# Patient Record
Sex: Male | Born: 1945 | Race: Black or African American | Hispanic: No | Marital: Married
Health system: Southern US, Community
[De-identification: ages and names within clinical notes are randomized; demographics above are authoritative.]

---

## 2020-01-03 ENCOUNTER — Encounter: Payer: Self-pay | Admitting: *Deleted

## 2020-01-03 ENCOUNTER — Other Ambulatory Visit: Payer: Self-pay | Admitting: *Deleted

## 2020-01-03 DIAGNOSIS — Z20822 Contact with and (suspected) exposure to covid-19: Secondary | ICD-10-CM

## 2020-01-04 LAB — NOVEL CORONAVIRUS, NAA: SARS-CoV-2, NAA: NOT DETECTED

## 2020-01-05 ENCOUNTER — Telehealth: Payer: Self-pay | Admitting: *Deleted

## 2020-01-05 NOTE — Telephone Encounter (Signed)
Pt notified that his test result for covid 19 was negative. He voiced understanding.

## 2021-03-29 ENCOUNTER — Encounter (HOSPITAL_BASED_OUTPATIENT_CLINIC_OR_DEPARTMENT_OTHER): Payer: Self-pay

## 2021-03-29 ENCOUNTER — Emergency Department (HOSPITAL_BASED_OUTPATIENT_CLINIC_OR_DEPARTMENT_OTHER)
Admission: EM | Admit: 2021-03-29 | Discharge: 2021-03-29 | Disposition: A | Discharge status: Home / Self Care | Payer: Medicare HMO | Attending: Emergency Medicine | Admitting: Emergency Medicine

## 2021-03-29 ENCOUNTER — Emergency Department (HOSPITAL_BASED_OUTPATIENT_CLINIC_OR_DEPARTMENT_OTHER): Payer: Medicare HMO

## 2021-03-29 ENCOUNTER — Other Ambulatory Visit: Payer: Self-pay

## 2021-03-29 DIAGNOSIS — M545 Low back pain, unspecified: Secondary | ICD-10-CM | POA: Diagnosis not present

## 2021-03-29 DIAGNOSIS — K5900 Constipation, unspecified: Secondary | ICD-10-CM | POA: Insufficient documentation

## 2021-03-29 MED ORDER — DIAZEPAM 5 MG PO TABS
5.0000 mg | ORAL_TABLET | Freq: Once | ORAL | Status: AC
  Administered 2021-03-29: 5 mg via ORAL
  Filled 2021-03-29: qty 1

## 2021-03-29 MED ORDER — KETOROLAC TROMETHAMINE 60 MG/2ML IM SOLN
60.0000 mg | Freq: Once | INTRAMUSCULAR | Status: AC
  Administered 2021-03-29: 60 mg via INTRAMUSCULAR
  Filled 2021-03-29: qty 2

## 2021-03-29 MED ORDER — IBUPROFEN 400 MG PO TABS: 400.0000 mg | ORAL_TABLET | Freq: Three times a day (TID) | ORAL | 0 refills | Status: AC

## 2021-03-29 MED ORDER — DIAZEPAM 5 MG PO TABS: 5.0000 mg | ORAL_TABLET | Freq: Four times a day (QID) | ORAL | 0 refills | Status: AC | PRN

## 2021-03-29 NOTE — ED Provider Notes (Signed)
[facility] EMERGENCY DEPARTMENT Provider Note   CSN: [id number] Arrival date & time: 03/29/21  2831     History Chief Complaint  Patient presents with  . Back Pain    Bryan Bennett is a 75 y.o. male.  75 year old male the presents emerged from today with bilateral lower back pain.  Patient states is worse on the left than the right.  Been going on for approximately a week.  Its intermittent.  Sometimes it improves.  He gets better with heat.  He is tried Tylenol couple times which does not seem to help much.  Patient states that throughout the day it often gets better but especially wakes up in the morning and at night it has been waking him up but tonight it woke him up and was worse than previously.  No trauma.  No history of cancer.  No IV drug use fevers or recent illnesses.  No neurologic changes.  Is able to ambulate slightly slower than normal but otherwise without difficulty. Also with increased urination.         History reviewed. No pertinent past medical history.  There are no problems to display for this patient.   History reviewed. No pertinent surgical history.     History reviewed. No pertinent family history.     Home Medications Prior to Admission medications   Not on File    Allergies    Patient has no known allergies.  Review of Systems   Review of Systems  All other systems reviewed and are negative.   Physical Exam Updated Vital Signs BP (!) 178/84 (BP Location: Right Arm)   Pulse 72   Temp 97.8 F (36.6 C) (Oral)   Resp 18   Ht 5\' 9"  (1.753 m)   Wt 86.6 kg   SpO2 96%   BMI 28.21 kg/m   Physical Exam Vitals and nursing note reviewed.  Constitutional:      Appearance: He is well-developed.  HENT:     Head: Normocephalic and atraumatic.     Nose: Nose normal. No congestion or rhinorrhea.     Mouth/Throat:     Mouth: Mucous membranes are moist.     Pharynx: Oropharynx is clear.  Eyes:     Pupils: Pupils are equal,  round, and reactive to light.  Cardiovascular:     Rate and Rhythm: Normal rate.  Pulmonary:     Effort: Pulmonary effort is normal. No respiratory distress.  Abdominal:     General: There is no distension.  Musculoskeletal:        General: Tenderness (bilateral lower back) present. Normal range of motion.     Cervical back: Normal range of motion.  Skin:    General: Skin is warm and dry.     Coloration: Skin is not jaundiced or pale.  Neurological:     General: No focal deficit present.     Mental Status: He is alert.     ED Results / Procedures / Treatments   Labs (all labs ordered are listed, but only abnormal results are displayed) Labs Reviewed - No data to display  EKG None  Radiology DG Lumbar Spine Complete  Result Date: 03/29/2021 CLINICAL DATA:  Back pain EXAM: LUMBAR SPINE - COMPLETE 4+ VIEW COMPARISON:  None similar and available FINDINGS: No evidence of fracture, bone lesion, or endplate erosion. Generalized mild disc space narrowing and endplate ridging for age. IMPRESSION: No acute or focal finding. Electronically Signed   By: 03/31/2021.D.  On: 03/29/2021 06:57    Procedures Procedures   Medications Ordered in ED Medications  ketorolac (TORADOL) injection 60 mg (60 mg Intramuscular Given 03/29/21 0644)  diazepam (VALIUM) tablet 5 mg (5 mg Oral Given 03/29/21 5427)    ED Course  I have reviewed the triage vital signs and the nursing notes.  Pertinent labs & imaging results that were available during my care of the patient were reviewed by me and considered in my medical decision making (see chart for details).    MDM Rules/Calculators/A&P                          Is a patient 75 years old I did do an x-ray and this was negative.  On my evaluation x-ray does have quite a bit of constipation and air associated with that but not see any obvious lesions on his spine.  No obvious fractures.  I considered possible urinary tract infection however his  polyuria is been going on for years he has no other urinary symptoms.  He was hypertensive when he got here but he states that his blood pressures run in the 140s he thinks is high because of the pain.  I did consider aortic dissection because of that and/or aneurysm however he has had a CT scan last few years it did not show an aneurysm and he has equal palpable DP pulses and no neurologic changes and all the pain is in his lower back and it seems to be both sides at this point so I do not think CT scan is necessary to evaluate for dissection.  It very much seems muscular in nature will treat as same. PCP to follow up if not improving.   Final Clinical Impression(s) / ED Diagnoses Final diagnoses:  Acute bilateral low back pain without sciatica  Constipation, unspecified constipation type    Rx / DC Orders ED Discharge Orders    None       Benno Brensinger, Barbara Cower, MD 03/29/21 2302

## 2021-03-29 NOTE — Discharge Instructions (Addendum)

## 2021-03-29 NOTE — ED Triage Notes (Signed)
Patient reports left lower back pain for 1 week. Ambulated to room without difficulty. NAD noted

## 2021-09-06 DEATH — deceased

## 2022-03-08 IMAGING — DX DG LUMBAR SPINE COMPLETE 4+V
5 series · 5 of 5 positions shown · non-contrast
Comparison: None similar and available

CLINICAL DATA: Back pain

EXAM:
LUMBAR SPINE - COMPLETE 4+ VIEW

[l-spine ap]
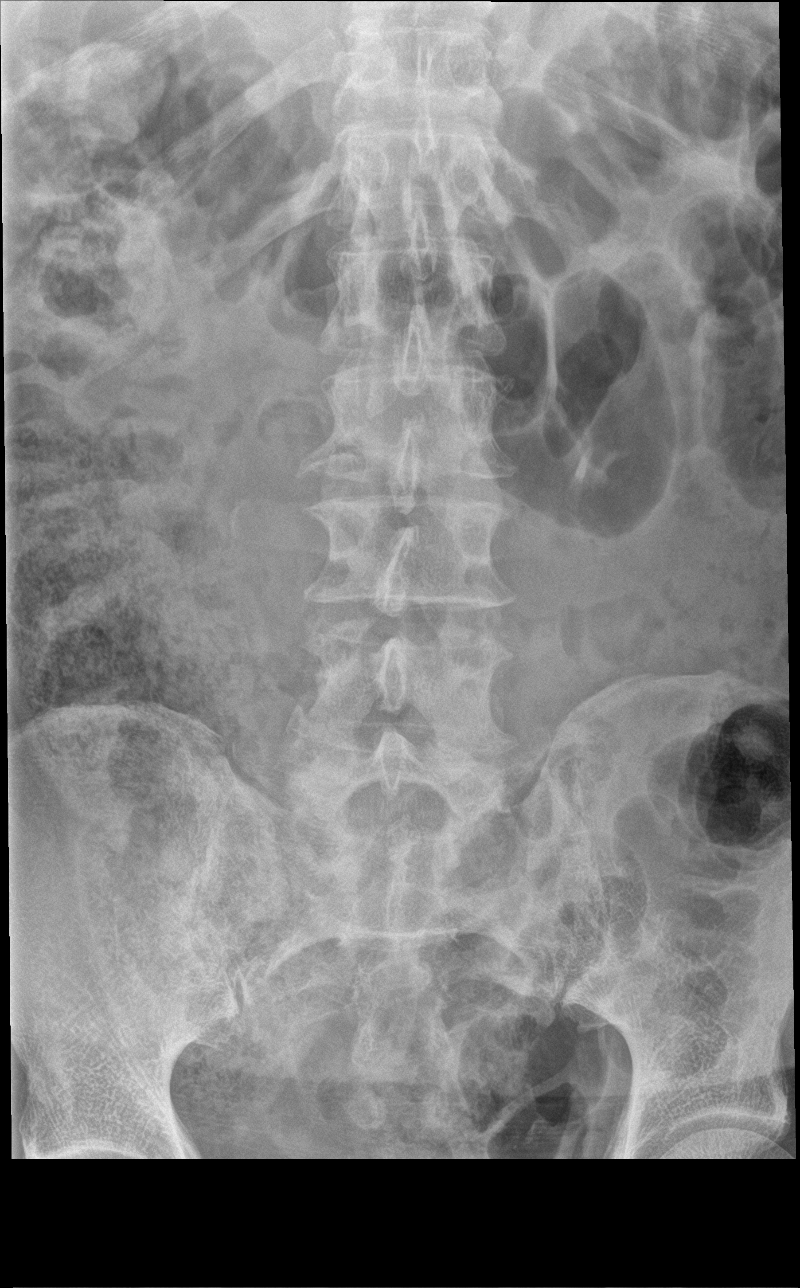

[l-spine obl (1 of 2)]
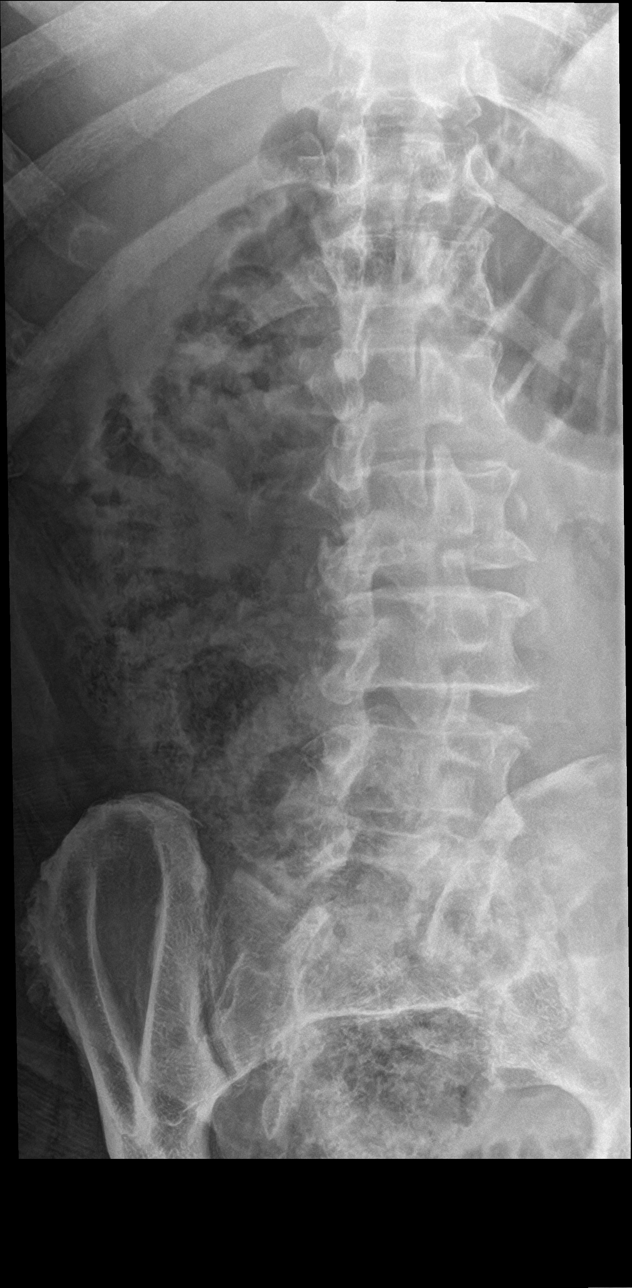

[l-spine obl (2 of 2)]
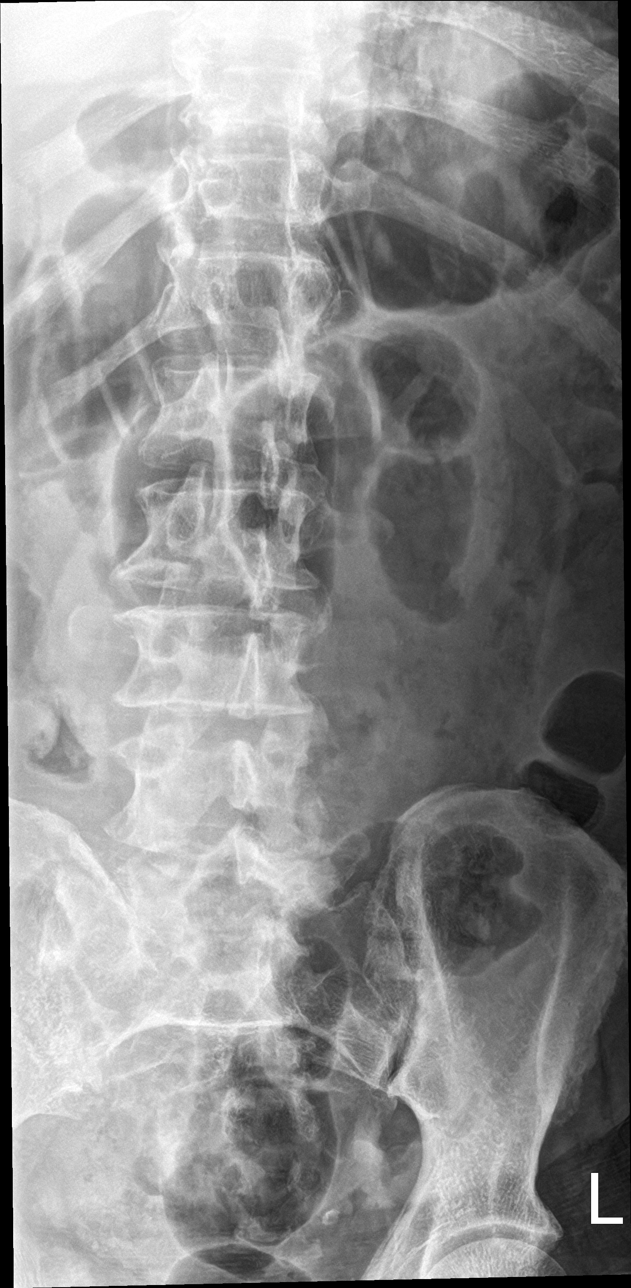

[l-spine lat]
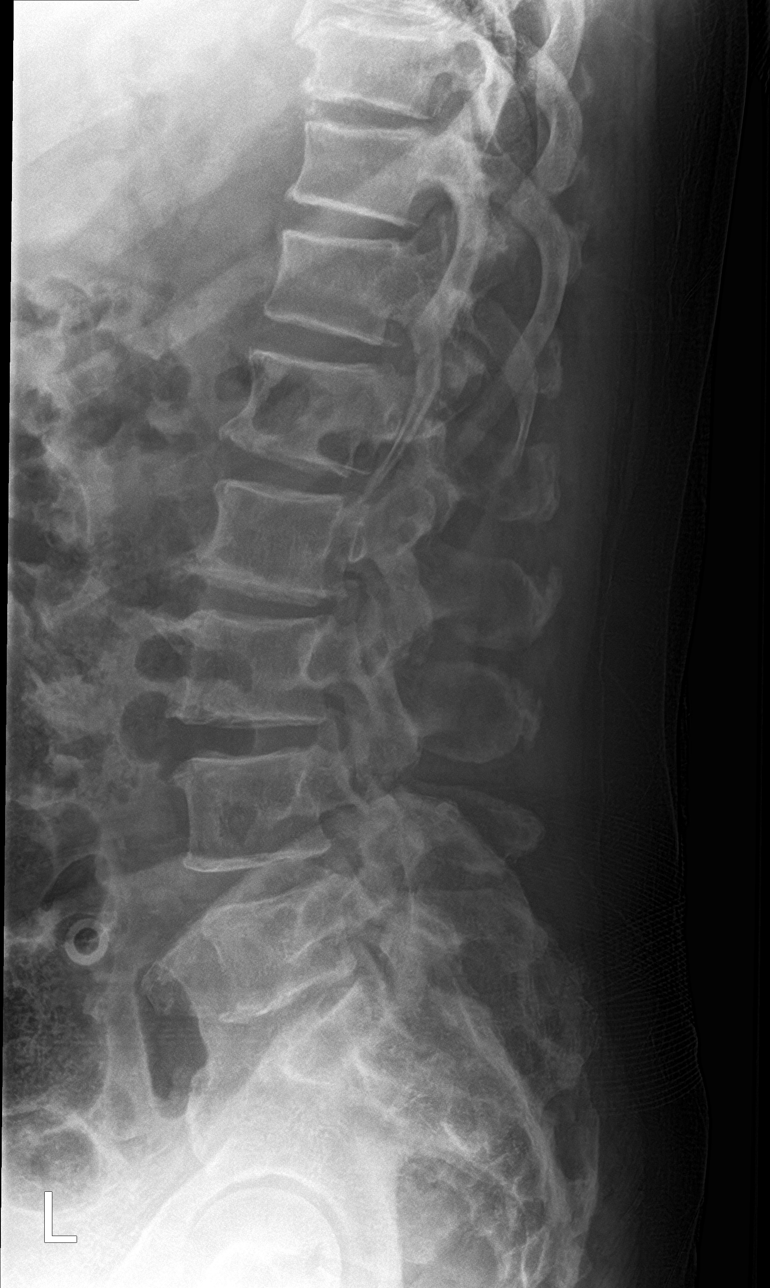

[l-spine spot]
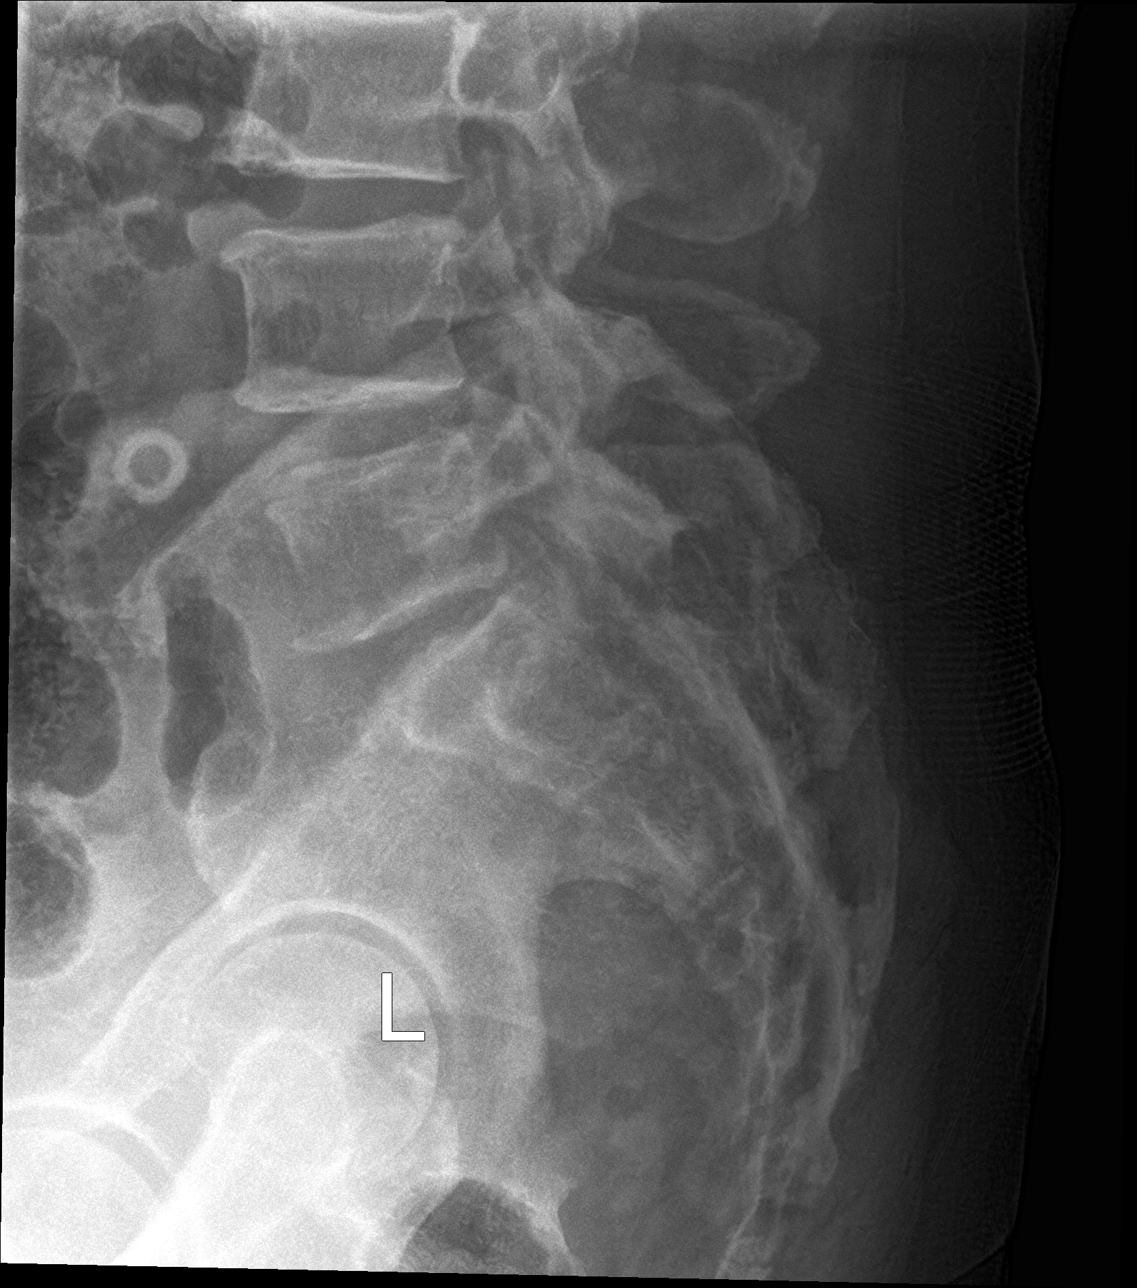

[5 of 5 positions shown; findings below may reference images not displayed]

FINDINGS: No evidence of fracture, bone lesion, or endplate erosion.

Generalized mild disc space narrowing and endplate ridging for age.
IMPRESSION: No acute or focal finding.
